# Patient Record
Sex: Male | Born: 1975 | Race: White | Hispanic: Yes | Marital: Single | Smoking: Never smoker
Health system: Southern US, Community
[De-identification: ages and names within clinical notes are randomized; demographics above are authoritative.]

---

## 2019-06-01 ENCOUNTER — Emergency Department (HOSPITAL_COMMUNITY): Payer: Self-pay

## 2019-06-01 ENCOUNTER — Emergency Department (HOSPITAL_COMMUNITY)
Admission: EM | Admit: 2019-06-01 | Discharge: 2019-06-01 | Disposition: A | Discharge status: Home / Self Care | Payer: Self-pay | Attending: Emergency Medicine | Admitting: Emergency Medicine

## 2019-06-01 ENCOUNTER — Other Ambulatory Visit: Payer: Self-pay

## 2019-06-01 ENCOUNTER — Encounter (HOSPITAL_COMMUNITY): Payer: Self-pay | Admitting: *Deleted

## 2019-06-01 DIAGNOSIS — G5603 Carpal tunnel syndrome, bilateral upper limbs: Secondary | ICD-10-CM | POA: Insufficient documentation

## 2019-06-01 LAB — CBG MONITORING, ED: Glucose-Capillary: 101 mg/dL — ABNORMAL HIGH (ref 70–99)

## 2019-06-01 MED ORDER — PREDNISONE 50 MG PO TABS
60.0000 mg | ORAL_TABLET | Freq: Once | ORAL | Status: AC
  Administered 2019-06-01: 60 mg via ORAL
  Filled 2019-06-01: qty 1

## 2019-06-01 MED ORDER — PREDNISONE 20 MG PO TABS: ORAL_TABLET | ORAL | 0 refills | Status: AC

## 2019-06-01 NOTE — Discharge Instructions (Signed)
Wear the wrist splints at night.  Take the medication as prescribed.  Please call Dr. Mort Sawyers office to get a an appointment to be evaluated for carpal tunnel syndrome.  Use las frulas de mueca por la noche. Tome el medicamento segn lo prescrito. Llame al consultorio del Dr. Romeo Apple para concertar una cita para que le evalen el sndrome del tnel carpiano.

## 2019-06-01 NOTE — ED Provider Notes (Signed)
[facility] EMERGENCY DEPARTMENT Provider Note   CSN: [id number] Arrival date & time: 06/01/19  0059   Time seen 5:35 AM  History Chief Complaint  Patient presents with  . Hand Pain   Spanish interpreter Judeth Cornfield [phone] was used  William Odom is a 44 y.o. male.  HPI patient states about a year ago he used to get numbness and pain in his hands that would last about 5 minutes.  It was initially at night.  However for the past 3 months he has been having constant pain and numbness in his hands.  He is right-handed.  He states he especially notices it in his index, middle, and ring fingers.  He denies any neck pain.  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   PCP Patient, No Pcp Per   History reviewed. No pertinent past medical history.  There are no problems to display for this patient.   History reviewed. No pertinent surgical history.     History reviewed. No pertinent family history.  Social History   Tobacco Use  . Smoking status: Never Smoker  . Smokeless tobacco: Never Used  Substance Use Topics  . Alcohol use: Yes    Comment: occasionally  . Drug use: Never  employed  Home Medications Prior to Admission medications   Medication Sig Start Date End Date Taking? Authorizing Provider  predniSONE (DELTASONE) 20 MG tablet Take 3 po QD x 3d , then 2 po QD x 3d then 1 po QD x 3d 06/01/19   Devoria Albe, MD    Allergies    Patient has no known allergies.  Review of Systems   Review of Systems  All other systems reviewed and are negative.   Physical Exam Updated Vital Signs BP 121/77   Pulse 60   Temp 98.3 F (36.8 C) (Oral)   Resp 16   Ht 5\' 6"  (1.676 m)   Wt 88.5 kg   SpO2 99%   BMI 31.47 kg/m     Physical Exam Vitals and nursing note reviewed.  Constitutional:      Appearance: Normal appearance. He is normal weight.  HENT:     Head: Normocephalic and atraumatic.     Nose: Nose normal.  Eyes:     Extraocular Movements: Extraocular movements intact.     Conjunctiva/sclera: Conjunctivae normal.     Pupils: Pupils are equal, round, and reactive to light.  Cardiovascular:     Rate and Rhythm: Normal rate.  Pulmonary:     Effort: Pulmonary effort is normal. No respiratory distress.  Musculoskeletal:     Cervical back: Normal range of motion and neck supple. No tenderness.     Comments: Patient does not have positive Tinel's sign on the left but mildly positive on the right.  Phalen's test is also negative.  Skin:    General: Skin is warm and dry.  Neurological:     General: No focal deficit present.     Mental Status: He is alert and oriented to person, place, and time.     Cranial Nerves: No cranial nerve deficit.  Psychiatric:        Mood and Affect: Mood normal.        Behavior: Behavior normal.        Thought Content: Thought content normal.     ED Results / Procedures / Treatments   Labs (all labs ordered are listed, but only abnormal results  are displayed) Labs Reviewed  CBG MONITORING, ED - Abnormal; Notable for the following components:      Result Value   Glucose-Capillary 101 (*)    All other components within normal limits   Laboratory interpretation all normal    EKG None  Radiology DG Cervical Spine Complete  Result Date: 06/01/2019 CLINICAL DATA:  Bilateral hand pain EXAM: CERVICAL SPINE - COMPLETE 4+ VIEW COMPARISON:  None. FINDINGS: Anterior spurring at C5-6 and C6-7. Disc spaces maintained. Normal alignment. No fracture. No neural foraminal narrowing. Prevertebral soft tissues are normal. IMPRESSION: No acute bony abnormality. Electronically Signed   By: Rolm Baptise M.D.   On: 06/01/2019 02:52    Procedures Procedures (including critical care time)  Medications Ordered in ED Medications  predniSONE (DELTASONE) tablet 60 mg (has no administration in time range)    ED Course  I have reviewed the triage vital signs and the nursing  notes.  Pertinent labs & imaging results that were available during my care of the patient were reviewed by me and considered in my medical decision making (see chart for details).  We discussed he most likely has carpal tunnel syndrome bilaterally.  He was placed in Velcro wrist splints to wear at night.  He was started on prednisone.  He was referred to Dr. Aline Brochure, the orthopedist to be evaluated further.     MDM Rules/Calculators/A&P                       Final Clinical Impression(s) / ED Diagnoses Final diagnoses:  Bilateral carpal tunnel syndrome    Rx / DC Orders ED Discharge Orders         Ordered    predniSONE (DELTASONE) 20 MG tablet     06/01/19 0353         Plan discharge  Rolland Porter, MD, Barbette Or, MD 06/01/19 [id number]

## 2019-06-01 NOTE — ED Triage Notes (Signed)
Pt c/o bilateral hand pain and numbness that has gotten progressively worse over the last few nights

## 2020-10-28 IMAGING — DX DG CERVICAL SPINE COMPLETE 4+V
6 series · 6 of 6 positions shown · non-contrast
Comparison: None.

CLINICAL DATA: Bilateral hand pain

EXAM:
CERVICAL SPINE - COMPLETE 4+ VIEW

[c-spine lat]
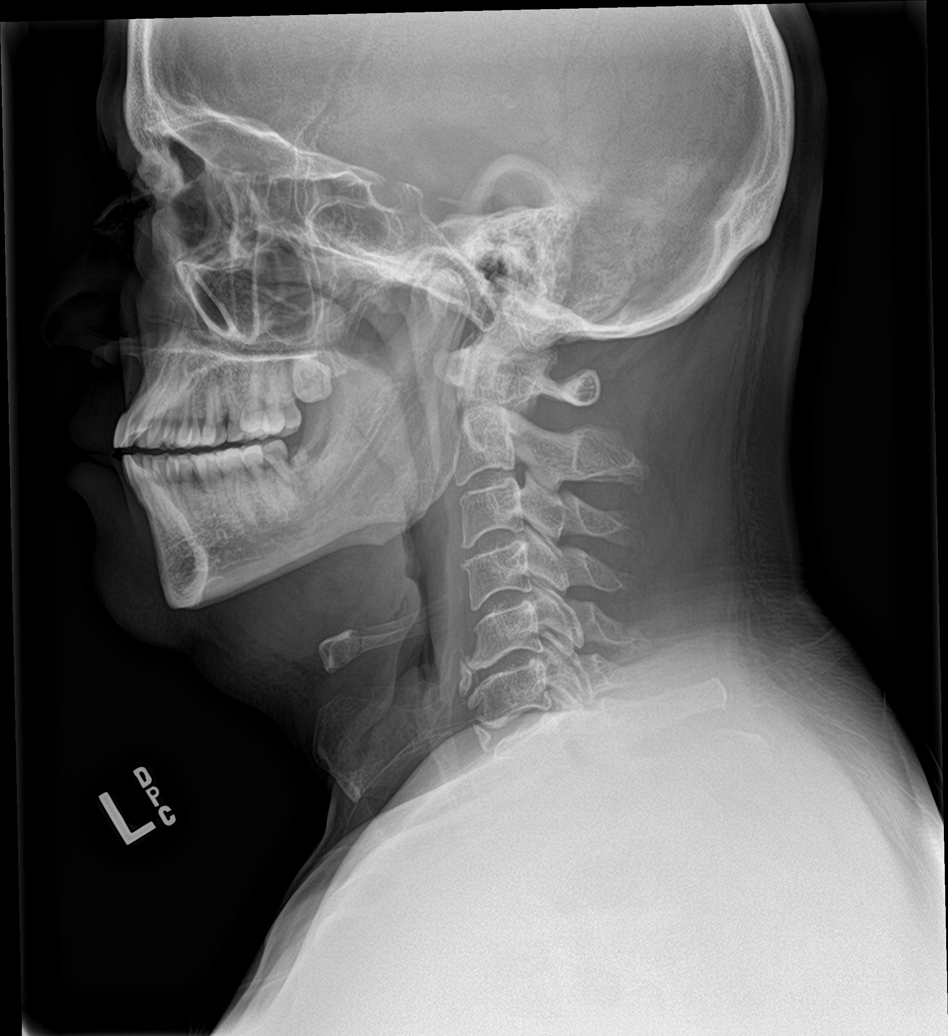

[c-spine obl (1 of 2)]
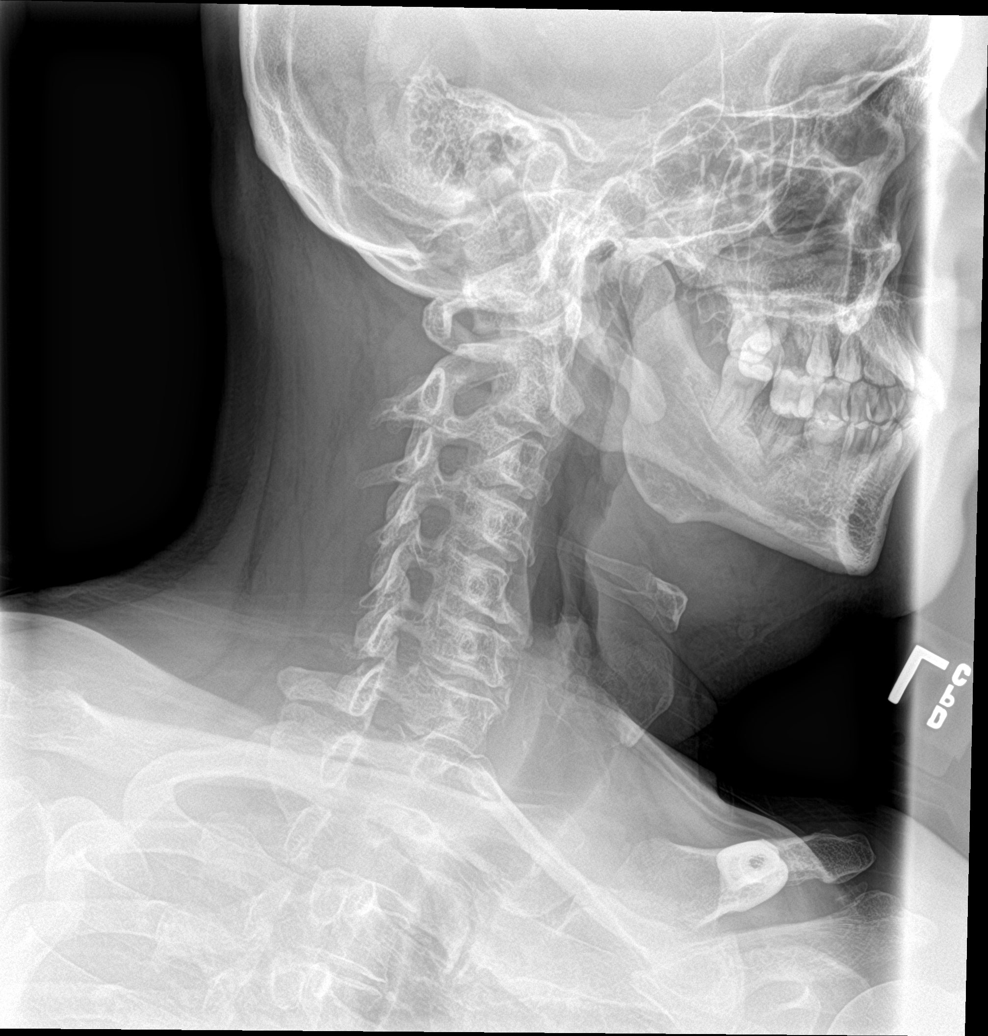

[c-spine obl (2 of 2)]
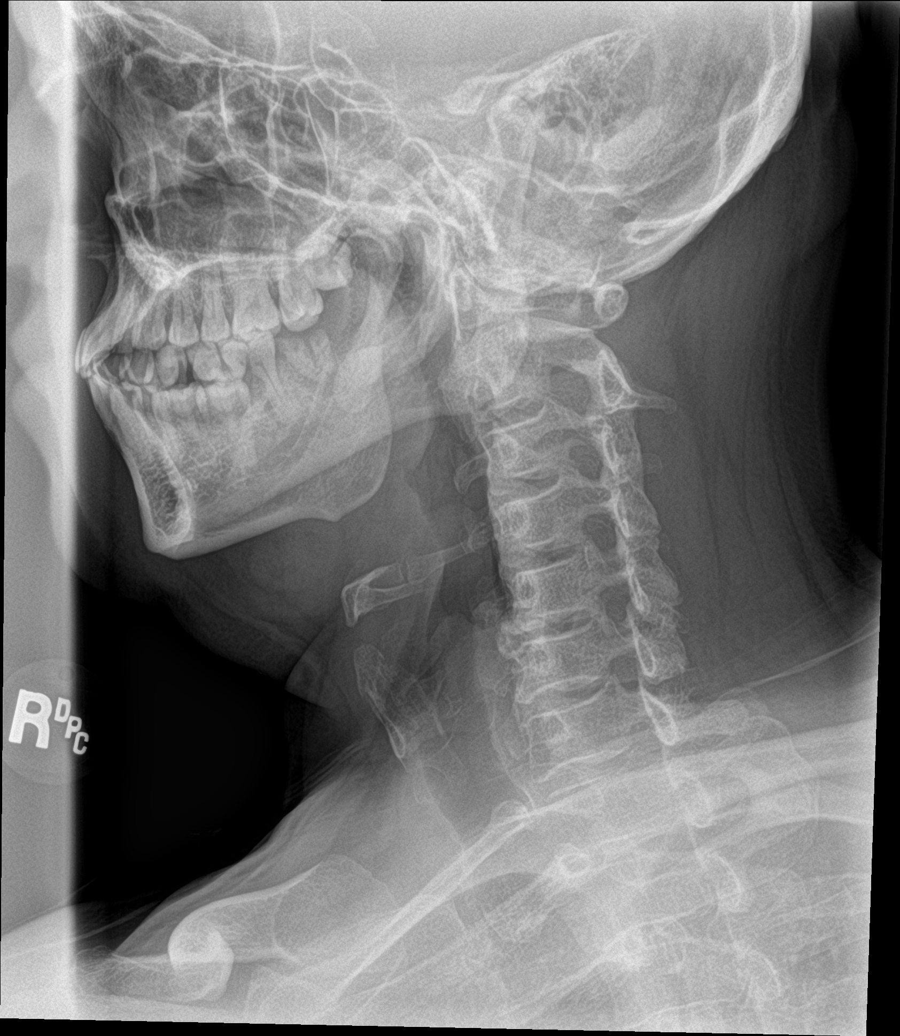

[c-spine ap]
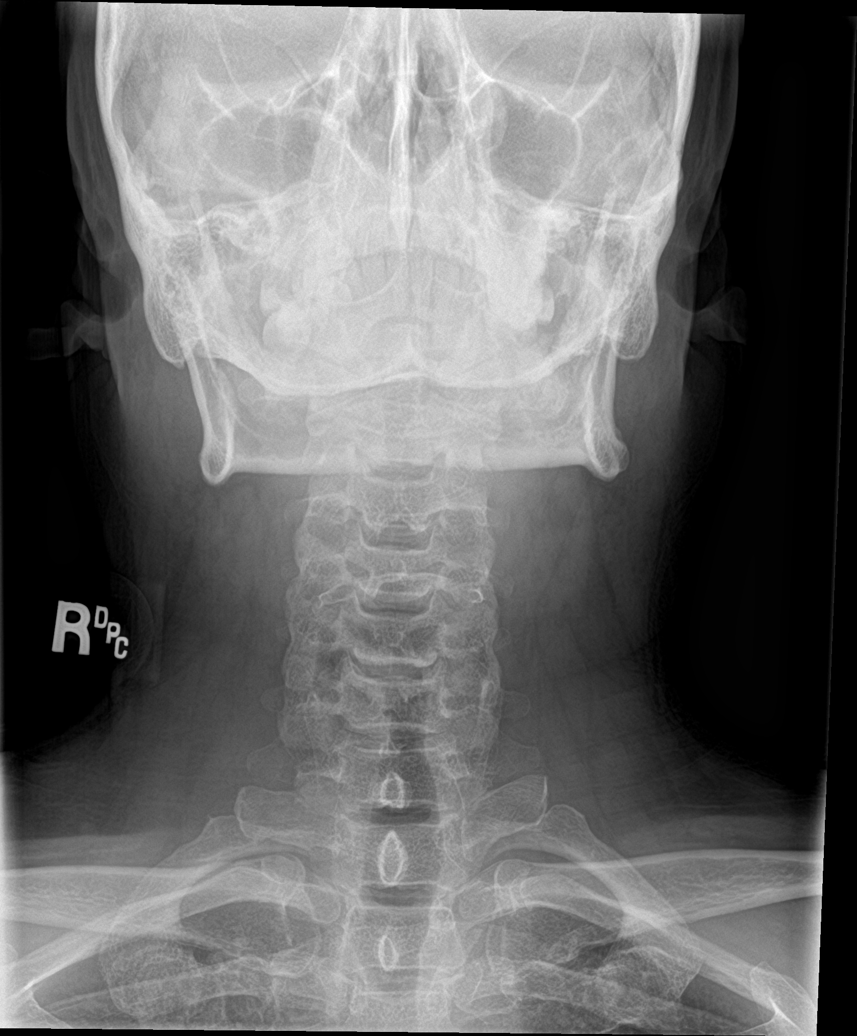

[c-spine open mouth]
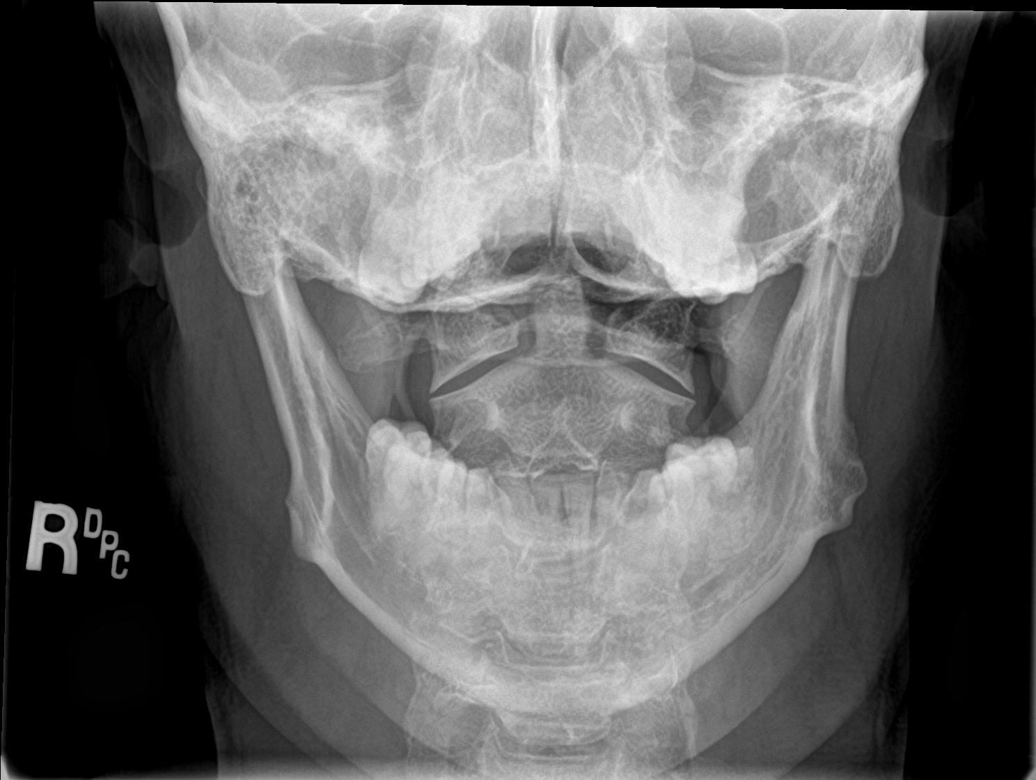

[c-spine swimmers trauma]
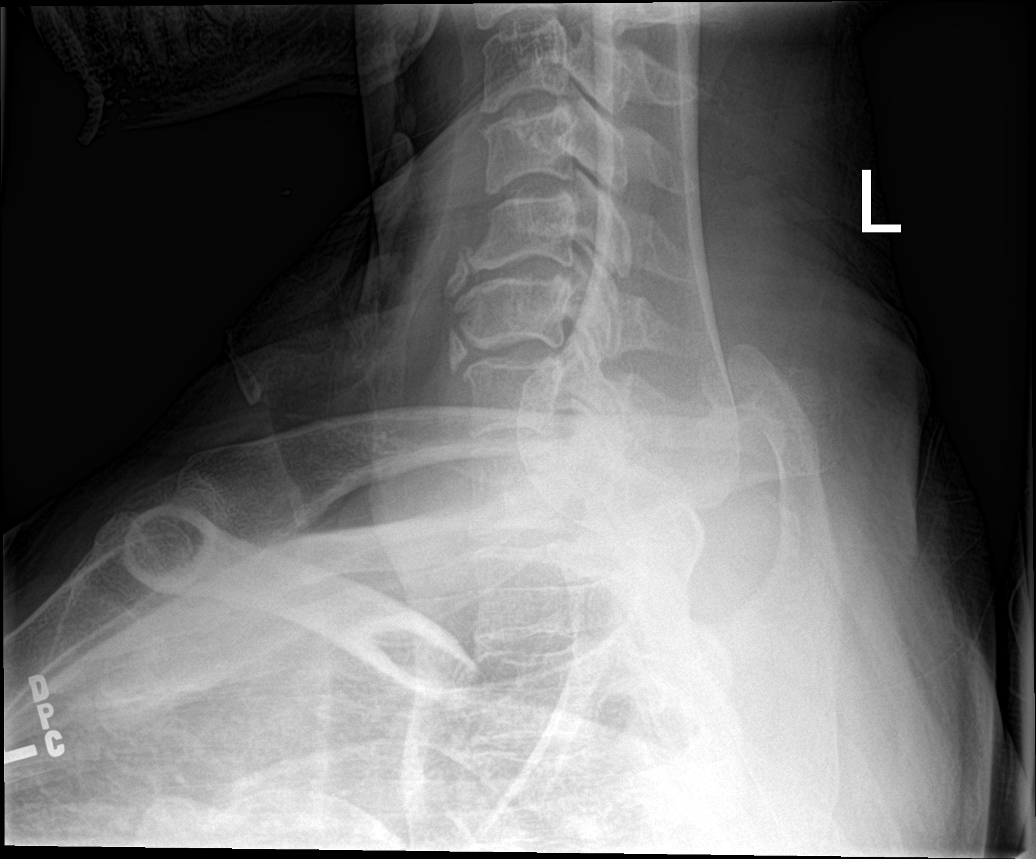

[6 of 6 positions shown; findings below may reference images not displayed]

FINDINGS: Anterior spurring at C5-6 and C6-7. Disc spaces maintained. Normal
alignment. No fracture. No neural foraminal narrowing. Prevertebral
soft tissues are normal.
IMPRESSION: No acute bony abnormality.
